# Patient Record
Sex: Male | Born: 1995 | Race: White | Hispanic: No | Marital: Married
Health system: Southern US, Community
[De-identification: ages and names within clinical notes are randomized; demographics above are authoritative.]

---

## 2017-11-19 ENCOUNTER — Other Ambulatory Visit: Payer: Self-pay

## 2017-11-19 ENCOUNTER — Encounter (HOSPITAL_COMMUNITY): Payer: Self-pay | Admitting: *Deleted

## 2017-11-19 ENCOUNTER — Emergency Department (HOSPITAL_COMMUNITY): Payer: Commercial Managed Care - PPO

## 2017-11-19 ENCOUNTER — Emergency Department (HOSPITAL_COMMUNITY)
Admission: EM | Admit: 2017-11-19 | Discharge: 2017-11-19 | Disposition: A | Discharge status: Home / Self Care | Payer: Commercial Managed Care - PPO | Attending: Emergency Medicine | Admitting: Emergency Medicine

## 2017-11-19 DIAGNOSIS — Y999 Unspecified external cause status: Secondary | ICD-10-CM | POA: Diagnosis not present

## 2017-11-19 DIAGNOSIS — S53401A Unspecified sprain of right elbow, initial encounter: Secondary | ICD-10-CM | POA: Insufficient documentation

## 2017-11-19 DIAGNOSIS — S52044A Nondisplaced fracture of coronoid process of right ulna, initial encounter for closed fracture: Secondary | ICD-10-CM | POA: Insufficient documentation

## 2017-11-19 DIAGNOSIS — Y929 Unspecified place or not applicable: Secondary | ICD-10-CM | POA: Diagnosis not present

## 2017-11-19 DIAGNOSIS — Y9351 Activity, roller skating (inline) and skateboarding: Secondary | ICD-10-CM | POA: Insufficient documentation

## 2017-11-19 DIAGNOSIS — S59901A Unspecified injury of right elbow, initial encounter: Secondary | ICD-10-CM | POA: Diagnosis present

## 2017-11-19 DIAGNOSIS — S42401A Unspecified fracture of lower end of right humerus, initial encounter for closed fracture: Secondary | ICD-10-CM

## 2017-11-19 MED ORDER — NAPROXEN 500 MG PO TABS: 500.0000 mg | ORAL_TABLET | Freq: Two times a day (BID) | ORAL | 0 refills | Status: AC

## 2017-11-19 MED ORDER — HYDROMORPHONE HCL 1 MG/ML IJ SOLN
1.0000 mg | Freq: Once | INTRAMUSCULAR | Status: AC
  Administered 2017-11-19: 1 mg via INTRAMUSCULAR
  Filled 2017-11-19: qty 1

## 2017-11-19 MED ORDER — OXYCODONE-ACETAMINOPHEN 5-325 MG PO TABS
1.0000 | ORAL_TABLET | Freq: Once | ORAL | Status: DC
  Filled 2017-11-19: qty 1

## 2017-11-19 MED ORDER — HYDROCODONE-ACETAMINOPHEN 5-325 MG PO TABS: 1.0000 | ORAL_TABLET | Freq: Four times a day (QID) | ORAL | 0 refills | Status: AC | PRN

## 2017-11-19 NOTE — Progress Notes (Signed)
Orthopedic Tech Progress Note Patient Details:  Mario SergeDeivis Pena February 27, 1996 [id number]  Ortho Devices Type of Ortho Device: Ace wrap, Long arm splint, Shoulder immobilizer Ortho Device/Splint Interventions: Application   Saul FordyceJennifer C Haily Caley 11/19/2017, 5:02 PM

## 2017-11-19 NOTE — Discharge Instructions (Addendum)
Keep your arm in a splint at all times.  Keep elevated.  Ice your elbow several times a day.  Take naproxen for pain.  Take Norco for severe pain only.  Follow-up with orthopedics as referred.  Return if any problems.
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
## 2017-11-19 NOTE — ED Notes (Signed)
PT states understanding of care given, follow up care, and medication prescribed. PT ambulated from ED to car with a steady gait. 
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
## 2017-11-24 ENCOUNTER — Ambulatory Visit
Admission: RE | Admit: 2017-11-24 | Discharge: 2017-11-24 | Disposition: A | Discharge status: Home / Self Care | Payer: Commercial Managed Care - PPO | Source: Ambulatory Visit | Attending: Orthopedic Surgery | Admitting: Orthopedic Surgery

## 2017-11-24 ENCOUNTER — Other Ambulatory Visit: Payer: Self-pay | Admitting: Orthopedic Surgery

## 2017-11-24 DIAGNOSIS — S52044A Nondisplaced fracture of coronoid process of right ulna, initial encounter for closed fracture: Secondary | ICD-10-CM

## 2019-07-02 IMAGING — CT CT 3D INDEPENDENT WKST
3 of 5 series · 11 of 27 positions shown, 13 images · non-contrast
Comparison: None.

CLINICAL DATA: Status post right elbow fracture 11/19/2017.
Nonspecific (abnormal) findings on radiological and other
examination of musculoskeletal system.

EXAM:
CT OF THE LOWER RIGHT EXTREMITY WITHOUT CONTRAST
CT - 3-DIMENSIONAL CT IMAGE RENDERING ON INDEPENDENT WORKSTATION
TECHNIQUE: Multidetector CT imaging of the right lower extremity was performed
according to the standard protocol.

[Series 3: ext-soft · axial · 0.38mm/px · z∈[+87,+177]mm · 4 of 76 slices shown, 5 images]
[im 16/76  soft-tissue]
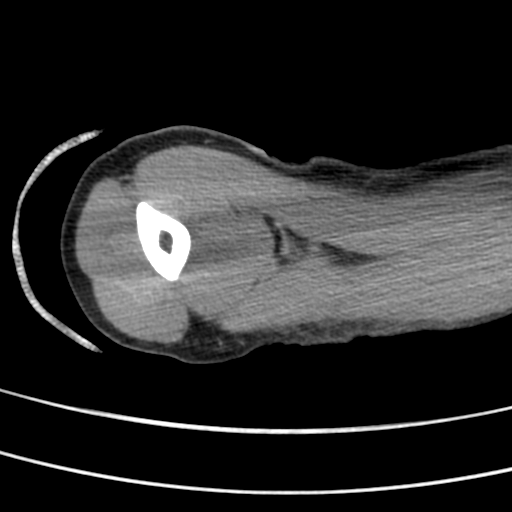
[im 16/76  bone]
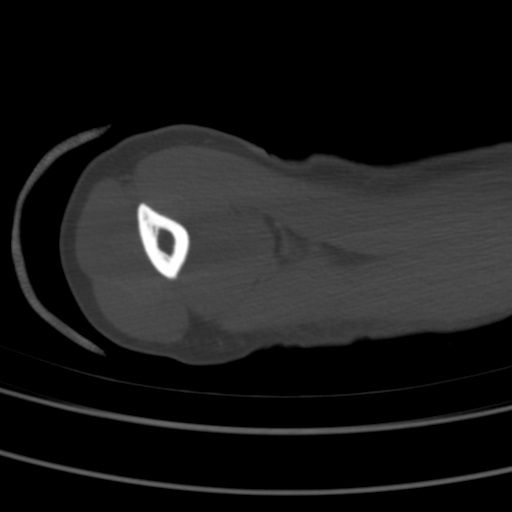
[im 31/76  bone]
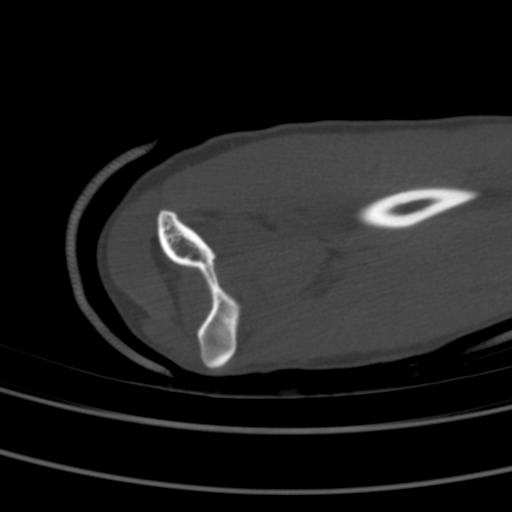
[im 46/76  bone]
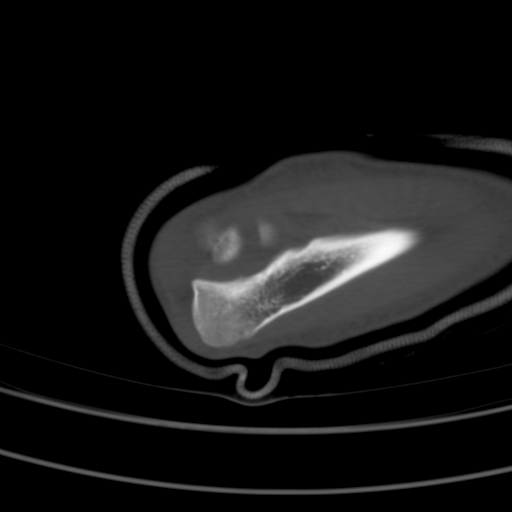
[im 61/76  bone]
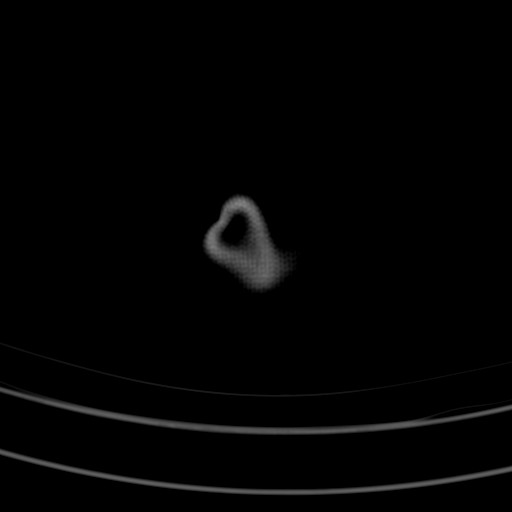

[Series 608: cor soft · axial · 0.38mm/px · z∈[+78,+107]mm · 2 of 48 slices shown]
[im 16/48  soft-tissue]
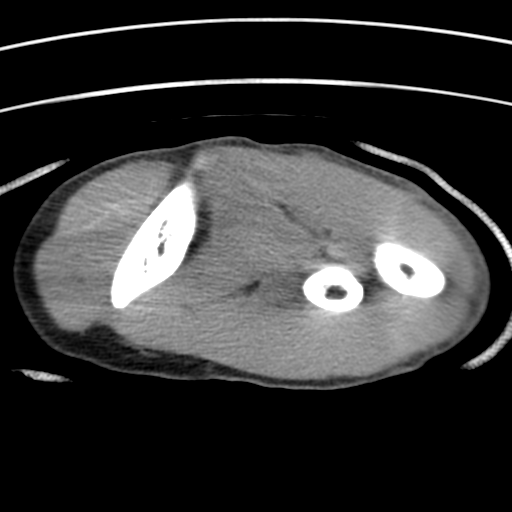
[im 32/48  soft-tissue]
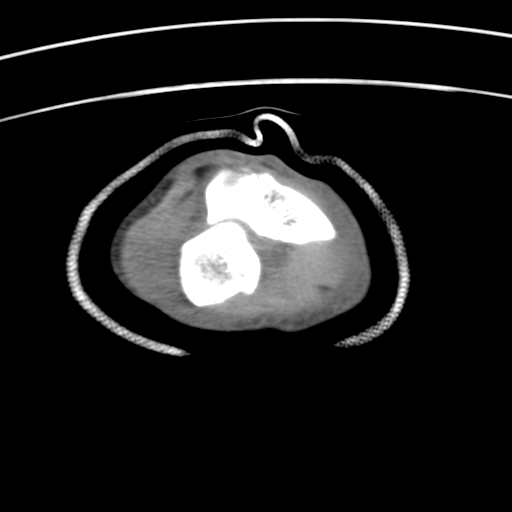

[Series 609: cor soft/distal humerus · sagittal · 0.38mm/px · 5 of 31 slices shown, 6 images]
[im 11/31  bone]
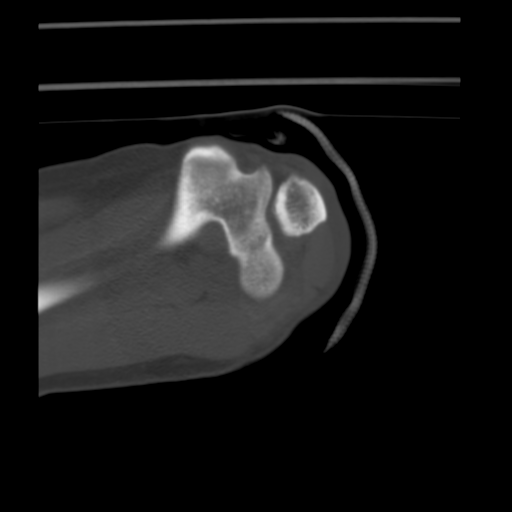
[im 13/31  bone]
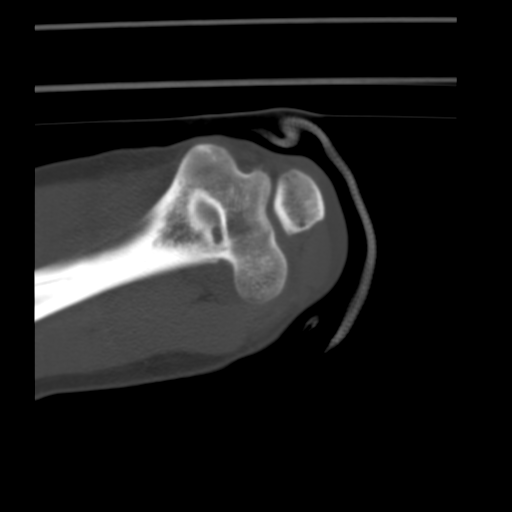
[im 16/31  soft-tissue]
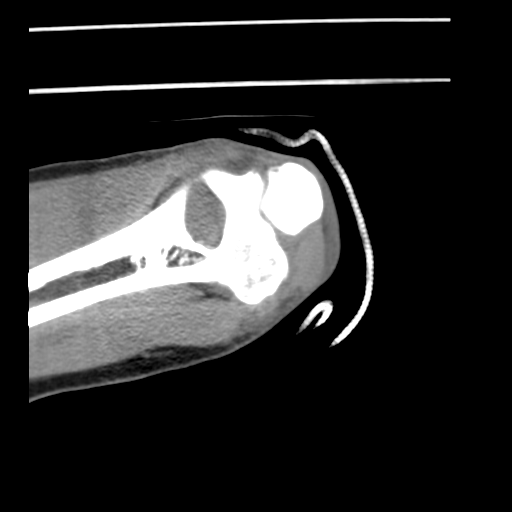
[im 16/31  bone]
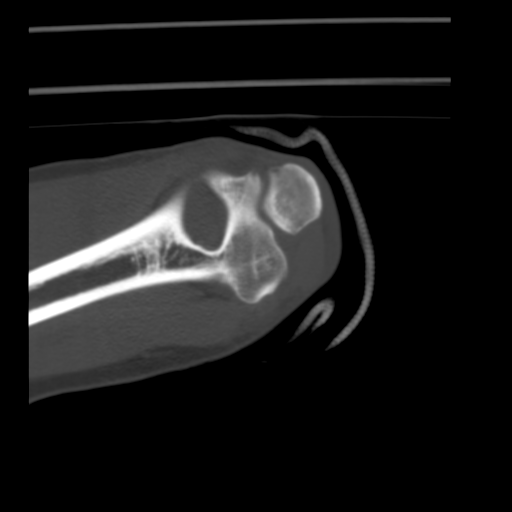
[im 18/31  bone]
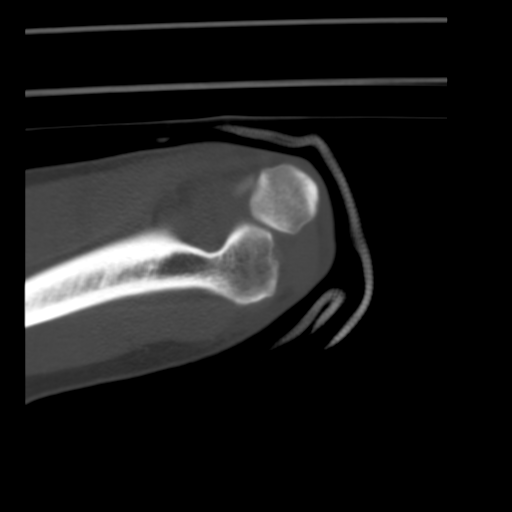
[im 21/31  bone]
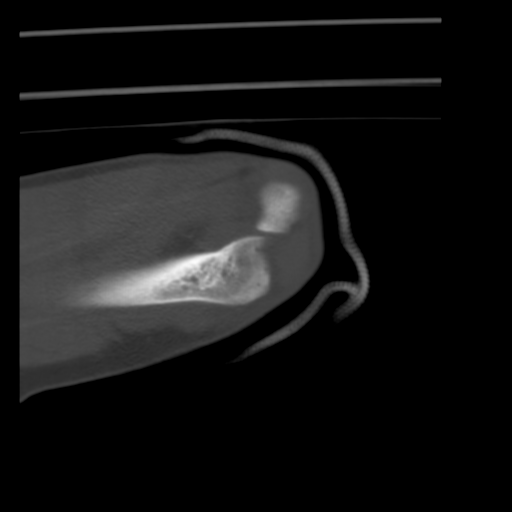

[11 of 27 positions shown; findings below may reference images not displayed]

FINDINGS: Bones/Joint/Cartilage

Nondisplaced fracture of the coronoid process with 2 mm of
distraction.

No other fracture or dislocation.  Moderate joint effusion.

Otherwise normal alignment. No aggressive lytic or sclerotic osseous
lesion.

Ligaments

Ligaments are suboptimally evaluated by CT.

Muscles and Tendons
Muscles are normal.  No muscle atrophy.

Soft tissue
No fluid collection or hematoma.  No soft tissue mass.
IMPRESSION: 1. Nondisplaced fracture of the coronoid process with 2 mm of
distraction. Moderate joint effusion.
